# Patient Record
Sex: Female | Born: 1960 | Race: White | Hispanic: No | Marital: Single | Smoking: Never smoker
Health system: Southern US, Community
[De-identification: ages and names within clinical notes are randomized; demographics above are authoritative.]

## PROBLEM LIST (undated history)

## (undated) DIAGNOSIS — E785 Hyperlipidemia, unspecified: Secondary | ICD-10-CM

## (undated) HISTORY — PX: BACK SURGERY: SHX140

## (undated) HISTORY — DX: Hyperlipidemia, unspecified: E78.5

---

## 2001-05-29 ENCOUNTER — Inpatient Hospital Stay (HOSPITAL_COMMUNITY): Admission: RE | Admit: 2001-05-29 | Discharge: 2001-05-30 | Payer: Self-pay | Admitting: Neurosurgery

## 2001-05-29 ENCOUNTER — Encounter: Payer: Self-pay | Admitting: Neurosurgery

## 2012-10-24 ENCOUNTER — Encounter: Payer: Self-pay | Admitting: Internal Medicine

## 2013-10-19 ENCOUNTER — Encounter: Payer: Self-pay | Admitting: Internal Medicine

## 2015-09-20 DIAGNOSIS — Z23 Encounter for immunization: Secondary | ICD-10-CM | POA: Diagnosis not present

## 2016-05-24 ENCOUNTER — Ambulatory Visit (INDEPENDENT_AMBULATORY_CARE_PROVIDER_SITE_OTHER): Payer: BLUE CROSS/BLUE SHIELD | Admitting: Family Medicine

## 2016-05-24 ENCOUNTER — Other Ambulatory Visit: Payer: Self-pay | Admitting: Family Medicine

## 2016-05-24 ENCOUNTER — Encounter: Payer: Self-pay | Admitting: Family Medicine

## 2016-05-24 VITALS — BP 120/80 | HR 71 | Temp 98.2°F | Wt 231.6 lb

## 2016-05-24 DIAGNOSIS — Z7689 Persons encountering health services in other specified circumstances: Secondary | ICD-10-CM | POA: Diagnosis not present

## 2016-05-24 DIAGNOSIS — R1011 Right upper quadrant pain: Secondary | ICD-10-CM | POA: Diagnosis not present

## 2016-05-24 LAB — COMPREHENSIVE METABOLIC PANEL
ALT: 52 U/L — ABNORMAL HIGH (ref 6–29)
AST: 35 U/L (ref 10–35)
Albumin: 4.1 g/dL (ref 3.6–5.1)
Alkaline Phosphatase: 142 U/L — ABNORMAL HIGH (ref 33–130)
BUN: 8 mg/dL (ref 7–25)
CO2: 25 mmol/L (ref 20–31)
Calcium: 9.3 mg/dL (ref 8.6–10.4)
Chloride: 104 mmol/L (ref 98–110)
Creat: 0.83 mg/dL (ref 0.50–1.05)
Glucose, Bld: 91 mg/dL (ref 65–99)
Potassium: 4.3 mmol/L (ref 3.5–5.3)
Sodium: 141 mmol/L (ref 135–146)
Total Bilirubin: 1.1 mg/dL (ref 0.2–1.2)
Total Protein: 6.8 g/dL (ref 6.1–8.1)

## 2016-05-24 LAB — CBC WITH DIFFERENTIAL/PLATELET
Basophils Absolute: 0 cells/uL (ref 0–200)
Basophils Relative: 0 %
Eosinophils Absolute: 92 cells/uL (ref 15–500)
Eosinophils Relative: 2 %
HCT: 39.4 % (ref 35.0–45.0)
Hemoglobin: 12.7 g/dL (ref 11.7–15.5)
Lymphocytes Relative: 32 %
Lymphs Abs: 1472 cells/uL (ref 850–3900)
MCH: 30 pg (ref 27.0–33.0)
MCHC: 32.2 g/dL (ref 32.0–36.0)
MCV: 92.9 fL (ref 80.0–100.0)
MPV: 9.2 fL (ref 7.5–12.5)
Monocytes Absolute: 322 cells/uL (ref 200–950)
Monocytes Relative: 7 %
Neutro Abs: 2714 cells/uL (ref 1500–7800)
Neutrophils Relative %: 59 %
Platelets: 296 10*3/uL (ref 140–400)
RBC: 4.24 MIL/uL (ref 3.80–5.10)
RDW: 13.1 % (ref 11.0–15.0)
WBC: 4.6 10*3/uL (ref 4.0–10.5)

## 2016-05-24 NOTE — Progress Notes (Signed)
   Subjective:    Patient ID: Andrea Riggs, female    DOB: [birth year], 56 y.o.   MRN: [id number]  HPI Chief Complaint  Patient presents with  . new pt    new pt get established. stomach pain that comes and goes. severe stomach pain 4 years ago but hasn't had it severe since   She is new to the practice and here to establish care.   Previous medical care: no PCP Last CPE: years   4 year history of intermittent RUQ pain. 1 episode every 3-4 months. 2 episodes this year so far.  3 weeks ago RUQ pain that felt like pressure. Denies pain today.  States pain is not affected by eating. Not affected by movement.  Sates she took a Gas X and Ibuprofen and pain improved several hours after passing a lot of gas.   Denies changes in bowel movements. Denies blood or pus in stool. No unexplained fatigue or weight loss.    Denies fever, chills, N/V/D, constipation, urinary symptoms, vaginal discharge.   LMP: 2009   Other providers: none   Social history: Lives alone , works as a Academic librarian  Denies smoking, drinking alcohol, drug use  Diet: fairly unhealthy  Excerise: nothing   Health maintenance:  Mammogram: 2008  Colonoscopy: never  Last Gynecological Exam: years ago Last Menstrual cycle: 2009  Reviewed allergies, medications, past medical, surgical, family, and social history.    Review of Systems Pertinent positives and negatives in the history of present illness.     Objective:   Physical Exam BP 120/80   Pulse 71   Temp 98.2 F (36.8 C) (Oral)   Wt 231 lb 9.6 oz (105.1 kg)  Alert and in no distress. Tympanic membranes and canals are normal. Pharyngeal area is normal. Neck is supple without adenopathy or thyromegaly. Cardiac exam shows a regular sinus rhythm without murmurs or gallops. Lungs are clear to auscultation. Abdomen soft, non tender, normal bowel sounds, no guarding, rebound, or hepatosplenomegaly. Negative McMurrays, negative McBurneys. Extremities  without edema, normal pulses. Skin warm and dry, no pallor.       Assessment & Plan:  RUQ abdominal pain - Plan: CBC with Differential/Platelet, Comprehensive metabolic panel  Encounter to establish care  Discussed that her exam is benign. Due to her family history of colon cancer and the fact that she has never had a colonoscopy, I will get her back next week for a fasting CPE and refer her for a colonoscopy. She is deficient in several areas of health maintenance and we will update these.

## 2016-05-25 ENCOUNTER — Other Ambulatory Visit: Payer: Self-pay | Admitting: Family Medicine

## 2016-05-25 DIAGNOSIS — R7989 Other specified abnormal findings of blood chemistry: Secondary | ICD-10-CM

## 2016-05-25 DIAGNOSIS — R945 Abnormal results of liver function studies: Principal | ICD-10-CM

## 2016-05-26 LAB — HEPATITIS PANEL, ACUTE
HCV Ab: NEGATIVE
Hep A IgM: NONREACTIVE
Hep B C IgM: NONREACTIVE
Hepatitis B Surface Ag: NEGATIVE

## 2016-05-30 ENCOUNTER — Encounter: Payer: Self-pay | Admitting: Family Medicine

## 2016-05-30 ENCOUNTER — Ambulatory Visit (INDEPENDENT_AMBULATORY_CARE_PROVIDER_SITE_OTHER): Payer: BLUE CROSS/BLUE SHIELD | Admitting: Family Medicine

## 2016-05-30 ENCOUNTER — Other Ambulatory Visit (HOSPITAL_COMMUNITY)
Admission: RE | Admit: 2016-05-30 | Discharge: 2016-05-30 | Disposition: A | Discharge status: Home / Self Care | Payer: BLUE CROSS/BLUE SHIELD | Source: Ambulatory Visit | Attending: Family Medicine | Admitting: Family Medicine

## 2016-05-30 VITALS — BP 130/84 | HR 82 | Ht 63.0 in | Wt 224.8 lb

## 2016-05-30 DIAGNOSIS — Z1211 Encounter for screening for malignant neoplasm of colon: Secondary | ICD-10-CM | POA: Diagnosis not present

## 2016-05-30 DIAGNOSIS — Z1159 Encounter for screening for other viral diseases: Secondary | ICD-10-CM | POA: Diagnosis not present

## 2016-05-30 DIAGNOSIS — Z1231 Encounter for screening mammogram for malignant neoplasm of breast: Secondary | ICD-10-CM | POA: Diagnosis not present

## 2016-05-30 DIAGNOSIS — Z0000 Encounter for general adult medical examination without abnormal findings: Secondary | ICD-10-CM

## 2016-05-30 DIAGNOSIS — E2839 Other primary ovarian failure: Secondary | ICD-10-CM

## 2016-05-30 DIAGNOSIS — Z833 Family history of diabetes mellitus: Secondary | ICD-10-CM

## 2016-05-30 DIAGNOSIS — Z114 Encounter for screening for human immunodeficiency virus [HIV]: Secondary | ICD-10-CM

## 2016-05-30 DIAGNOSIS — Z8349 Family history of other endocrine, nutritional and metabolic diseases: Secondary | ICD-10-CM

## 2016-05-30 DIAGNOSIS — Z1239 Encounter for other screening for malignant neoplasm of breast: Secondary | ICD-10-CM

## 2016-05-30 DIAGNOSIS — Z124 Encounter for screening for malignant neoplasm of cervix: Secondary | ICD-10-CM

## 2016-05-30 DIAGNOSIS — E669 Obesity, unspecified: Secondary | ICD-10-CM

## 2016-05-30 DIAGNOSIS — Z23 Encounter for immunization: Secondary | ICD-10-CM

## 2016-05-30 DIAGNOSIS — Z01419 Encounter for gynecological examination (general) (routine) without abnormal findings: Secondary | ICD-10-CM | POA: Diagnosis not present

## 2016-05-30 DIAGNOSIS — Z800 Family history of malignant neoplasm of digestive organs: Secondary | ICD-10-CM

## 2016-05-30 LAB — LIPID PANEL
Cholesterol: 231 mg/dL — ABNORMAL HIGH (ref <200)
HDL: 44 mg/dL — ABNORMAL LOW (ref >50)
LDL Cholesterol: 170 mg/dL — ABNORMAL HIGH (ref <100)
Total CHOL/HDL Ratio: 5.3 Ratio — ABNORMAL HIGH (ref <5.0)
Triglycerides: 86 mg/dL (ref <150)
VLDL: 17 mg/dL (ref <30)

## 2016-05-30 LAB — POCT URINALYSIS DIPSTICK
Bilirubin, UA: NEGATIVE
Blood, UA: NEGATIVE
Glucose, UA: NEGATIVE
Ketones, UA: NEGATIVE
Leukocytes, UA: NEGATIVE
Nitrite, UA: NEGATIVE
Protein, UA: NEGATIVE
Spec Grav, UA: 1.01 (ref 1.010–1.025)
Urobilinogen, UA: 0.2 E.U./dL
pH, UA: 6 (ref 5.0–8.0)

## 2016-05-30 NOTE — Progress Notes (Signed)
Subjective:    Patient ID: Andrea Riggs, female    DOB: 08/26/1960, 56 y.o.   MRN: [id number]  HPI Chief Complaint  Patient presents with  . Annual Exam   She is here for a complete physical exam. 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  Diet: unhealthy  Excerise: none   Toenail fungus for years, not bothersome.   Immunizations: Tdap unknown   Health maintenance:  Mammogram: 2008 Colonoscopy: never and family history significant for colon cancer in father and brother.  Last Gynecological Exam: 2008 Last Menstrual cycle: 2009 Last Dental Exam: last month  Last Eye Exam: never  Depression screen [facility] 2/9 05/30/2016  Decreased Interest 0  Down, Depressed, Hopeless 0  PHQ - 2 Score 0     Wears seatbelt always, uses sunscreen, smoke detectors in home and functioning, does not text while driving and feels safe in home environment.   Reviewed allergies, medications, past medical, surgical, family, and social history.    Review of Systems Review of Systems Constitutional: -fever, -chills, -sweats, -unexpected weight change,-fatigue ENT: -runny nose, -ear pain, -sore throat Cardiology:  -chest pain, -palpitations, -edema Respiratory: -cough, -shortness of breath, -wheezing Gastroenterology: -abdominal pain, -nausea, -vomiting, -diarrhea, -constipation  Hematology: -bleeding or bruising problems Musculoskeletal: -arthralgias, -myalgias, -joint swelling, -back pain Ophthalmology: -vision changes Urology: -dysuria, -difficulty urinating, -hematuria, -urinary frequency, -urgency Neurology: -headache, -weakness, -tingling, -numbness       Objective:   Physical Exam BP 130/84   Pulse 82   Ht 5\' 3"  (1.6 m)   Wt 224 lb 12.8 oz (102 kg)   SpO2 97%   BMI 39.82 kg/m   General Appearance:    Alert, cooperative, no distress, appears stated age   Head:    Normocephalic, without obvious abnormality, atraumatic  Eyes:    PERRL, conjunctiva/corneas clear, EOM's intact, fundi    benign  Ears:    Normal TM's and external ear canals  Nose:   Nares normal, mucosa normal, no drainage or sinus   tenderness  Throat:   Lips, mucosa, and tongue normal; teeth and gums normal  Neck:   Supple, no lymphadenopathy;  thyroid:  no   enlargement/tenderness/nodules; no carotid   bruit or JVD  Back:    Spine nontender, no curvature, ROM normal, no CVA     tenderness  Lungs:     Clear to auscultation bilaterally without wheezes, rales or     ronchi; respirations unlabored  Chest Wall:    No tenderness or deformity   Heart:    Regular rate and rhythm, S1 and S2 normal, no murmur, rub   or gallop  Breast Exam:    No tenderness, masses, or nipple discharge or inversion.      No axillary lymphadenopathy  Abdomen:     Soft, non-tender, nondistended, normoactive bowel sounds,    no masses, no hepatosplenomegaly  Genitalia:    Normal external genitalia without lesions.  BUS and vagina normal; cervix without lesions, or cervical motion tenderness. No abnormal vaginal discharge.  Uterus and adnexa not enlarged, nontender, no masses.  Pap performed. Chaperone present.   Rectal:    Referred to GI   Extremities:   No clubbing, cyanosis or edema  Pulses:   2+ and symmetric all extremities  Skin:   Skin color, texture, turgor normal, no rashes or lesions  Lymph nodes:  Cervical, supraclavicular, and axillary nodes normal  Neurologic:   CNII-XII intact, normal strength, sensation and gait; reflexes 2+ and symmetric throughout          Psych:   Normal mood, affect, hygiene and grooming.    Urinalysis dipstick: spec grav 1.010, neg      Assessment & Plan:  Routine general medical examination at a health care facility - Plan: POCT urinalysis dipstick, TSH, Lipid panel  Obesity (BMI 30-39.9) - Plan: TSH, Lipid panel  Screening for breast cancer - Plan: MM DIGITAL  SCREENING BILATERAL  Screening for colon cancer - Plan: Ambulatory referral to Gastroenterology  Need for Tdap vaccination - Plan: Tdap vaccine greater than or equal to 7yo IM  Family history of diabetes mellitus in first degree relative - Plan: Hemoglobin A1c  Family history of thyroid disease in mother - Plan: TSH  Family history of colon cancer in father - Plan: Ambulatory referral to Gastroenterology  Screening for cervical cancer - Plan: Cytology - PAP  Estrogen deficiency - Plan: DG Bone Density  Screening for HIV (human immunodeficiency virus) - Plan: HIV antibody  She admittedly has not been good about preventive health and I congratulated her on coming in today for her CPE.  Counseled her on obesity and the potential health risks this presents. Advised on healthy diet and exercise.  Counseled on health maintenance and safety.  GI referral for colonoscopy.  Ordered mammogram and bone density.  Pap smear performed.  Tdap given.  Plan to have her follow up pending lab results and after her abdominal US next week for elevated LFTs and intermittent RUQ pain.

## 2016-05-30 NOTE — Patient Instructions (Addendum)
Call and schedule your mammogram and bone density.  The GI office will call you to discuss colonoscopy and office visit.  Call and schedule an eye exam.   Here is a list of Eye Doctors that you call and schedule an appointment with.   [facility] Address: [place], [place], [place] [address] Phone: [phone]   [facility] Address: [address] [place], [place], [place] [address] Phone: [phone]  Dr. Katy Fitch Address: [address] [place], [place] [address] Phone: [phone]  [facility] [address], [place], [place]  [address] Telephone: [phone]  Preventative Care for Adults - Female      [place]:  A routine yearly physical is a good way to check in with your primary care provider about your health and preventive screening. It is also an opportunity to share updates about your health and any concerns you have, and receive a thorough all-over exam.   Most health insurance companies pay for at least some preventative services.  Check with your health plan for specific coverages.  WHAT PREVENTATIVE SERVICES DO WOMEN NEED?  Adult women should have their weight and blood pressure checked regularly.   Women age 38 and older should have their cholesterol levels checked regularly.  Women should be screened for cervical cancer with a Pap smear and pelvic exam beginning at either age 16, or 3 years after they become sexually activity.    Breast cancer screening generally begins at age 42 with a mammogram and breast exam by your primary care provider.    Beginning at age 35 and continuing to age 22, women should be screened for colorectal cancer.  Certain people may need continued testing until age 70.  Updating vaccinations is part of preventative care.  Vaccinations help protect against diseases such as the flu.  Osteoporosis is a disease in which the bones lose minerals and strength as we age. Women ages 38  and over should discuss this with their caregivers, as should women after menopause who have other risk factors.  Lab tests are generally done as part of preventative care to screen for anemia and blood disorders, to screen for problems with the kidneys and liver, to screen for bladder problems, to check blood sugar, and to check your cholesterol level.  Preventative services generally include counseling about diet, exercise, avoiding tobacco, drugs, excessive alcohol consumption, and sexually transmitted infections.    GENERAL RECOMMENDATIONS FOR GOOD HEALTH:  Healthy diet:  Eat a variety of foods, including fruit, vegetables, animal or vegetable protein, such as meat, fish, chicken, and eggs, or beans, lentils, tofu, and grains, such as rice.  Drink plenty of water daily.  Decrease saturated fat in the diet, avoid lots of red meat, processed foods, sweets, fast foods, and fried foods.  Exercise:  Aerobic exercise helps maintain good heart health. At least 30-40 minutes of moderate-intensity exercise is recommended. For example, a brisk walk that increases your heart rate and breathing. This should be done on most days of the week.   Find a type of exercise or a variety of exercises that you enjoy so that it becomes a part of your daily life.  Examples are running, walking, swimming, water aerobics, and biking.  For motivation and support, explore group exercise such as aerobic class, spin class, Zumba, Yoga,or  martial arts, etc.    Set exercise goals for yourself, such as a certain weight goal, walk or run  in a race such as a 5k walk/run.  Speak to your primary care provider about exercise goals.  Disease prevention:  If you smoke or chew tobacco, find out from your caregiver how to quit. It can literally save your life, no matter how long you have been a tobacco user. If you do not use tobacco, never begin.   Maintain a healthy diet and normal weight. Increased weight leads to  problems with blood pressure and diabetes.   The Body Mass Index or BMI is a way of measuring how much of your body is fat. Having a BMI above 27 increases the risk of heart disease, diabetes, hypertension, stroke and other problems related to obesity. Your caregiver can help determine your BMI and based on it develop an exercise and dietary program to help you achieve or maintain this important measurement at a healthful level.  High blood pressure causes heart and blood vessel problems.  Persistent high blood pressure should be treated with medicine if weight loss and exercise do not work.   Fat and cholesterol leaves deposits in your arteries that can block them. This causes heart disease and vessel disease elsewhere in your body.  If your cholesterol is found to be high, or if you have heart disease or certain other medical conditions, then you may need to have your cholesterol monitored frequently and be treated with medication.   Ask if you should have a cardiac stress test if your history suggests this. A stress test is a test done on a treadmill that looks for heart disease. This test can find disease prior to there being a problem.  Menopause can be associated with physical symptoms and risks. Hormone replacement therapy is available to decrease these. You should talk to your caregiver about whether starting or continuing to take hormones is right for you.   Osteoporosis is a disease in which the bones lose minerals and strength as we age. This can result in serious bone fractures. Risk of osteoporosis can be identified using a bone density scan. Women ages 56 and over should discuss this with their caregivers, as should women after menopause who have other risk factors. Ask your caregiver whether you should be taking a calcium supplement and Vitamin D, to reduce the rate of osteoporosis.   Avoid drinking alcohol in excess (more than two drinks per day).  Avoid use of street drugs. Do not  share needles with anyone. Ask for professional help if you need assistance or instructions on stopping the use of alcohol, cigarettes, and/or drugs.  Brush your teeth twice a day with fluoride toothpaste, and floss once a day. Good oral hygiene prevents tooth decay and gum disease. The problems can be painful, unattractive, and can cause other health problems. Visit your dentist for a routine oral and dental check up and preventive care every 6-12 months.   Look at your skin regularly.  Use a mirror to look at your back. Notify your caregivers of changes in moles, especially if there are changes in shapes, colors, a size larger than a pencil eraser, an irregular border, or development of new moles.  Safety:  Use seatbelts 100% of the time, whether driving or as a passenger.  Use safety devices such as hearing protection if you work in environments with loud noise or significant background noise.  Use safety glasses when doing any work that could send debris in to the eyes.  Use a helmet if you ride a bike or motorcycle.  Use  appropriate safety gear for contact sports.  Talk to your caregiver about gun safety.  Use sunscreen with a SPF (or skin protection factor) of 15 or greater.  Lighter skinned people are at a greater risk of skin cancer. Don't forget to also wear sunglasses in order to protect your eyes from too much damaging sunlight. Damaging sunlight can accelerate cataract formation.   Practice safe sex. Use condoms. Condoms are used for birth control and to help reduce the spread of sexually transmitted infections (or STIs).  Some of the STIs are gonorrhea (the clap), chlamydia, syphilis, trichomonas, herpes, HPV (human papilloma virus) and HIV (human immunodeficiency virus) which causes AIDS. The herpes, HIV and HPV are viral illnesses that have no cure. These can result in disability, cancer and death.   Keep carbon monoxide and smoke detectors in your home functioning at all times. Change  the batteries every 6 months or use a model that plugs into the wall.   Vaccinations:  Stay up to date with your tetanus shots and other required immunizations. You should have a booster for tetanus every 10 years. Be sure to get your flu shot every year, since 5%-20% of the [place] population comes down with the flu. The flu vaccine changes each year, so being vaccinated once is not enough. Get your shot in the fall, before the flu season peaks.   Other vaccines to consider:  Human Papilloma Virus or HPV causes cancer of the cervix, and other infections that can be transmitted from person to person. There is a vaccine for HPV, and females should get immunized between the ages of 36 and 20. It requires a series of 3 shots.   Pneumococcal vaccine to protect against certain types of pneumonia.  This is normally recommended for adults age 63 or older.  However, adults younger than 56 years old with certain underlying conditions such as diabetes, heart or lung disease should also receive the vaccine.  Shingles vaccine to protect against Varicella Zoster if you are older than age 30, or younger than 56 years old with certain underlying illness.  Hepatitis A vaccine to protect against a form of infection of the liver by a virus acquired from food.  Hepatitis B vaccine to protect against a form of infection of the liver by a virus acquired from blood or body fluids, particularly if you work in health care.  If you plan to travel internationally, check with your local health department for specific vaccination recommendations.  Cancer Screening:  Breast cancer screening is essential to preventive care for women. All women age 41 and older should perform a breast self-exam every month. At age 27 and older, women should have their caregiver complete a breast exam each year. Women at ages 65 and older should have a mammogram (x-ray film) of the breasts. Your caregiver can discuss how often you need  mammograms.    Cervical cancer screening includes taking a Pap smear (sample of cells examined under a microscope) from the cervix (end of the uterus). It also includes testing for HPV (Human Papilloma Virus, which can cause cervical cancer). Screening and a pelvic exam should begin at age 12, or 3 years after a woman becomes sexually active. Screening should occur every year, with a Pap smear but no HPV testing, up to age 99. After age 44, you should have a Pap smear every 3 years with HPV testing, if no HPV was found previously.   Most routine colon cancer screening begins at the age  of 50. On a yearly basis, doctors may provide special easy to use take-home tests to check for hidden blood in the stool. Sigmoidoscopy or colonoscopy can detect the earliest forms of colon cancer and is life saving. These tests use a small camera at the end of a tube to directly examine the colon. Speak to your caregiver about this at age 64, when routine screening begins (and is repeated every 5 years unless early forms of pre-cancerous polyps or small growths are found).

## 2016-05-31 LAB — TSH: TSH: 3.16 mIU/L

## 2016-05-31 LAB — HEMOGLOBIN A1C
Hgb A1c MFr Bld: 5.5 % (ref <5.7)
MEAN PLASMA GLUCOSE: 111 mg/dL

## 2016-05-31 LAB — HIV ANTIBODY (ROUTINE TESTING W REFLEX): HIV: NONREACTIVE

## 2016-06-05 ENCOUNTER — Encounter: Payer: Self-pay | Admitting: Gastroenterology

## 2016-06-05 ENCOUNTER — Ambulatory Visit
Admission: RE | Admit: 2016-06-05 | Discharge: 2016-06-05 | Disposition: A | Discharge status: Home / Self Care | Payer: Self-pay | Source: Ambulatory Visit | Attending: Family Medicine | Admitting: Family Medicine

## 2016-06-05 DIAGNOSIS — R945 Abnormal results of liver function studies: Principal | ICD-10-CM

## 2016-06-05 DIAGNOSIS — K760 Fatty (change of) liver, not elsewhere classified: Secondary | ICD-10-CM | POA: Diagnosis not present

## 2016-06-05 DIAGNOSIS — R7989 Other specified abnormal findings of blood chemistry: Secondary | ICD-10-CM

## 2016-06-05 LAB — CYTOLOGY - PAP
DIAGNOSIS: NEGATIVE
HPV: NOT DETECTED

## 2016-06-25 ENCOUNTER — Encounter: Payer: Self-pay | Admitting: Gastroenterology

## 2016-06-25 ENCOUNTER — Ambulatory Visit (AMBULATORY_SURGERY_CENTER): Payer: Self-pay

## 2016-06-25 VITALS — Ht 63.0 in | Wt 225.2 lb

## 2016-06-25 DIAGNOSIS — Z1211 Encounter for screening for malignant neoplasm of colon: Secondary | ICD-10-CM

## 2016-06-25 MED ORDER — NA SULFATE-K SULFATE-MG SULF 17.5-3.13-1.6 GM/177ML PO SOLN: 1.0000 | Freq: Once | ORAL | 0 refills | Status: AC

## 2016-06-25 NOTE — Progress Notes (Signed)
Denies allergies to eggs or soy products. Denies complication of anesthesia or sedation. Denies use of weight loss medication. Denies use of O2.   Emmi instructions given for colonoscopy.  

## 2016-07-05 ENCOUNTER — Telehealth: Payer: Self-pay

## 2016-07-05 NOTE — Telephone Encounter (Signed)
Patient confirms she can arrive at 1:30 pm for her scheduled procedure.

## 2016-07-05 NOTE — Telephone Encounter (Signed)
Left message to call back. Dr Silverio Decamp needs her to arrive at 1:30 pm on 07/09/16. That is 1 hour forward from the original arrival time of 12:30 pm. Ask the patient to call back and confirm she has this message and understands.

## 2016-07-09 ENCOUNTER — Encounter: Payer: Self-pay | Admitting: Gastroenterology

## 2016-07-09 ENCOUNTER — Ambulatory Visit (AMBULATORY_SURGERY_CENTER): Payer: BLUE CROSS/BLUE SHIELD | Admitting: Gastroenterology

## 2016-07-09 VITALS — BP 121/71 | HR 60 | Temp 98.2°F | Resp 23 | Ht 63.0 in | Wt 225.0 lb

## 2016-07-09 DIAGNOSIS — D124 Benign neoplasm of descending colon: Secondary | ICD-10-CM

## 2016-07-09 DIAGNOSIS — D125 Benign neoplasm of sigmoid colon: Secondary | ICD-10-CM | POA: Diagnosis not present

## 2016-07-09 DIAGNOSIS — D126 Benign neoplasm of colon, unspecified: Secondary | ICD-10-CM

## 2016-07-09 DIAGNOSIS — Z1211 Encounter for screening for malignant neoplasm of colon: Secondary | ICD-10-CM | POA: Diagnosis present

## 2016-07-09 DIAGNOSIS — D128 Benign neoplasm of rectum: Secondary | ICD-10-CM

## 2016-07-09 DIAGNOSIS — Z1212 Encounter for screening for malignant neoplasm of rectum: Secondary | ICD-10-CM

## 2016-07-09 DIAGNOSIS — K621 Rectal polyp: Secondary | ICD-10-CM

## 2016-07-09 DIAGNOSIS — K635 Polyp of colon: Secondary | ICD-10-CM

## 2016-07-09 DIAGNOSIS — Z800 Family history of malignant neoplasm of digestive organs: Secondary | ICD-10-CM | POA: Diagnosis not present

## 2016-07-09 MED ORDER — SODIUM CHLORIDE 0.9 % IV SOLN: 500.0000 mL | INTRAVENOUS | Status: AC

## 2016-07-09 NOTE — Patient Instructions (Signed)
YOU HAD AN ENDOSCOPIC PROCEDURE TODAY AT [facility]:   Refer to the procedure report that was given to you for any specific questions about what was found during the examination.  If the procedure report does not answer your questions, please call your gastroenterologist to clarify.  If you requested that your care partner not be given the details of your procedure findings, then the procedure report has been included in a sealed envelope for you to review at your convenience later.  YOU SHOULD EXPECT: Some feelings of bloating in the abdomen. Passage of more gas than usual.  Walking can help get rid of the air that was put into your GI tract during the procedure and reduce the bloating. If you had a lower endoscopy (such as a colonoscopy or flexible sigmoidoscopy) you may notice spotting of blood in your stool or on the toilet paper. If you underwent a bowel prep for your procedure, you may not have a normal bowel movement for a few days.  Please Note:  You might notice some irritation and congestion in your nose or some drainage.  This is from the oxygen used during your procedure.  There is no need for concern and it should clear up in a day or so.  SYMPTOMS TO REPORT IMMEDIATELY:   Following lower endoscopy (colonoscopy or flexible sigmoidoscopy):  Excessive amounts of blood in the stool  Significant tenderness or worsening of abdominal pains  Swelling of the abdomen that is new, acute  Fever of 100F or higher    For urgent or emergent issues, a gastroenterologist can be reached at any hour by calling [phone].   DIET:  We do recommend a small meal at first, but then you may proceed to your regular diet.  Drink plenty of fluids but you should avoid alcoholic beverages for 24 hours.  ACTIVITY:  You should plan to take it easy for the rest of today and you should NOT DRIVE or use heavy machinery until tomorrow (because of the sedation medicines used during the test).     FOLLOW UP: Our staff will call the number listed on your records the next business day following your procedure to check on you and address any questions or concerns that you may have regarding the information given to you following your procedure. If we do not reach you, we will leave a message.  However, if you are feeling well and you are not experiencing any problems, there is no need to return our call.  We will assume that you have returned to your regular daily activities without incident.  If any biopsies were taken you will be contacted by phone or by letter within the next 1-3 weeks.  Please call us at [phone] if you have not heard about the biopsies in 3 weeks.    SIGNATURES/CONFIDENTIALITY: You and/or your care partner have signed paperwork which will be entered into your electronic medical record.  These signatures attest to the fact that that the information above on your After Visit Summary has been reviewed and is understood.  Full responsibility of the confidentiality of this discharge information lies with you and/or your care-partner.   Information on polyps & hemorrhoids given to you today  Await pathology results

## 2016-07-09 NOTE — Op Note (Signed)
[facility] Patient Name: Andrea Riggs Procedure Date: 07/09/2016 2:30 PM MRN: [id number] Endoscopist: Mauri Pole , MD Age: 56 Referring MD:  Date of Birth: 1960/10/22 Gender: Female Account #: [id number] Procedure:                Colonoscopy Indications:              Screening in patient at increased risk: Colorectal                            cancer in father before age 53, Screening in                            patient at increased risk: Colorectal cancer in                            brother before age 61 Medicines:                Monitored Anesthesia Care Procedure:                Pre-Anesthesia Assessment:                           - Prior to the procedure, a History and Physical                            was performed, and patient medications and                            allergies were reviewed. The patient's tolerance of                            previous anesthesia was also reviewed. The risks                            and benefits of the procedure and the sedation                            options and risks were discussed with the patient.                            All questions were answered, and informed consent                            was obtained. Prior Anticoagulants: The patient has                            taken no previous anticoagulant or antiplatelet                            agents. ASA Grade Assessment: II - A patient with                            mild systemic disease. After reviewing the risks  and benefits, the patient was deemed in                            satisfactory condition to undergo the procedure.                           After obtaining informed consent, the colonoscope                            was passed under direct vision. Throughout the                            procedure, the patient's blood pressure, pulse, and                            oxygen saturations were monitored  continuously. The                            Model PCF-[id number]) scope was introduced                            through the anus and advanced to the the cecum,                            identified by appendiceal orifice and ileocecal                            valve. The colonoscopy was performed without                            difficulty. The patient tolerated the procedure                            well. The quality of the bowel preparation was                            excellent. The ileocecal valve, appendiceal                            orifice, and rectum were photographed. Scope In: 2:39:05 PM Scope Out: 3:01:14 PM Scope Withdrawal Time: 0 hours 15 minutes 14 seconds  Total Procedure Duration: 0 hours 22 minutes 9 seconds  Findings:                 The perianal and digital rectal examinations were                            normal.                           A 2 mm polyp was found in the descending colon. The                            polyp was sessile. The polyp was removed with a  cold biopsy forceps. Resection and retrieval were                            complete.                           Four sessile polyps were found in the rectum X1 and                            sigmoid colon X3 The polyps were 3 to 6 mm in size.                            These polyps were removed with a cold snare.                            Resection and retrieval were complete.                           Non-bleeding internal hemorrhoids were found during                            retroflexion. The hemorrhoids were small. Complications:            No immediate complications. Estimated Blood Loss:     Estimated blood loss was minimal. Impression:               - One 2 mm polyp in the descending colon, removed                            with a cold biopsy forceps. Resected and retrieved.                           - Four 3 to 6 mm polyps in the rectum and in the                             sigmoid colon, removed with a cold snare. Resected                            and retrieved.                           - Non-bleeding internal hemorrhoids. Recommendation:           - Patient has a contact number available for                            emergencies. The signs and symptoms of potential                            delayed complications were discussed with the                            patient. Return to normal activities tomorrow.  Written discharge instructions were provided to the                            patient.                           - Resume previous diet.                           - Continue present medications.                           - Await pathology results.                           - Repeat colonoscopy in 3 - 5 years for                            surveillance based on pathology results. Mauri Pole, MD 07/09/2016 3:07:19 PM This report has been signed electronically.

## 2016-07-09 NOTE — Progress Notes (Signed)
Called to room to assist during endoscopic procedure.  Patient ID and intended procedure confirmed with present staff. Received instructions for my participation in the procedure from the performing physician.  

## 2016-07-09 NOTE — Progress Notes (Signed)
Pt's states no medical or surgical changes since previsit or office visit. 

## 2016-07-09 NOTE — Progress Notes (Signed)
To recovery, report to RN, VSS. 

## 2016-07-10 ENCOUNTER — Telehealth: Payer: Self-pay | Admitting: *Deleted

## 2016-07-10 NOTE — Telephone Encounter (Signed)
  Follow up Call-  Call back number 07/09/2016  Post procedure Call Back phone  # [phone]  Permission to leave phone message Yes  Some recent data might be hidden     Patient questions:  Do you have a fever, pain , or abdominal swelling? No. Pain Score  0 *  Have you tolerated food without any problems? Yes.    Have you been able to return to your normal activities? Yes.    Do you have any questions about your discharge instructions: Diet   No. Medications  No. Follow up visit  No.  Do you have questions or concerns about your Care? No.  Actions: * If pain score is 4 or above: No action needed, pain <4.

## 2016-07-13 ENCOUNTER — Encounter: Payer: Self-pay | Admitting: Gastroenterology

## 2016-07-15 ENCOUNTER — Encounter: Payer: Self-pay | Admitting: Family Medicine

## 2016-07-15 DIAGNOSIS — R933 Abnormal findings on diagnostic imaging of other parts of digestive tract: Secondary | ICD-10-CM | POA: Insufficient documentation

## 2016-09-25 DIAGNOSIS — Z23 Encounter for immunization: Secondary | ICD-10-CM | POA: Diagnosis not present

## 2017-10-22 DIAGNOSIS — Z23 Encounter for immunization: Secondary | ICD-10-CM | POA: Diagnosis not present

## 2018-09-25 IMAGING — US US ABDOMEN COMPLETE
1 series · 14 of 25 positions shown · non-contrast
Comparison: No prior.

CLINICAL DATA: Elevated LFTs.

EXAM:
ABDOMEN ULTRASOUND COMPLETE

[Series 1: us abdomen complete · 0.18mm/px · 14 of 75 slices shown]
[im 1/75]
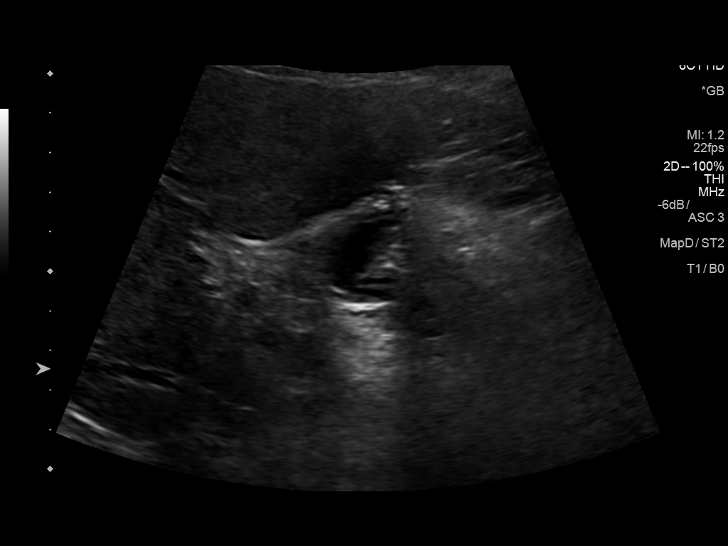
[im 7/75]
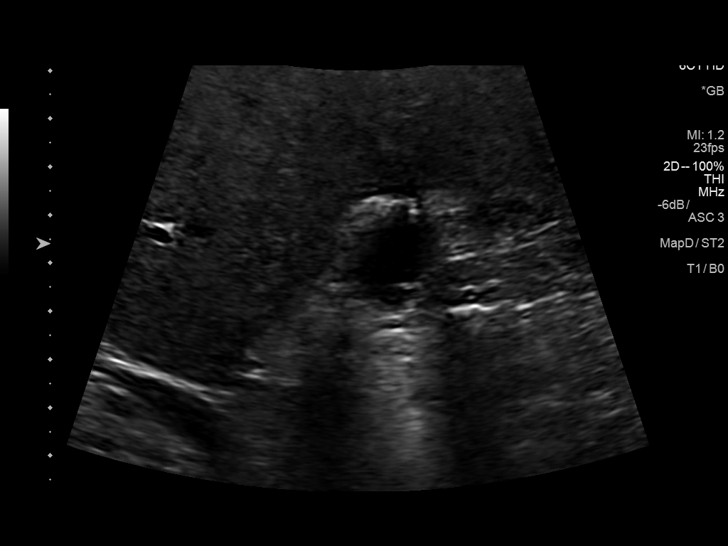
[im 13/75]
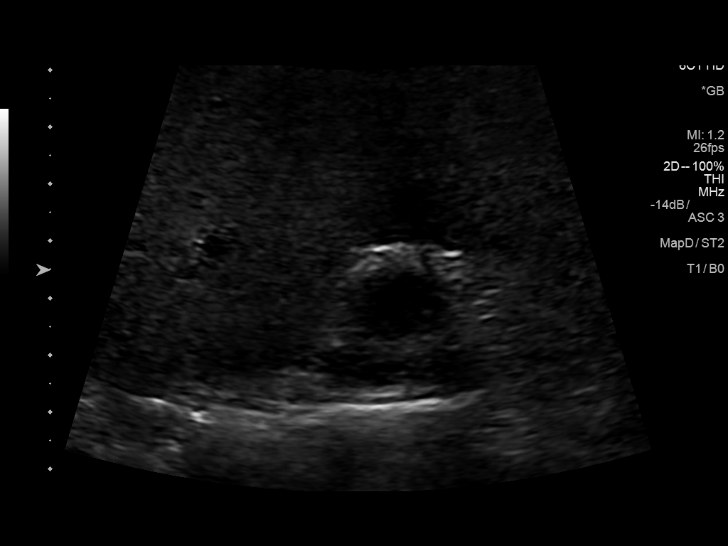
[im 19/75]
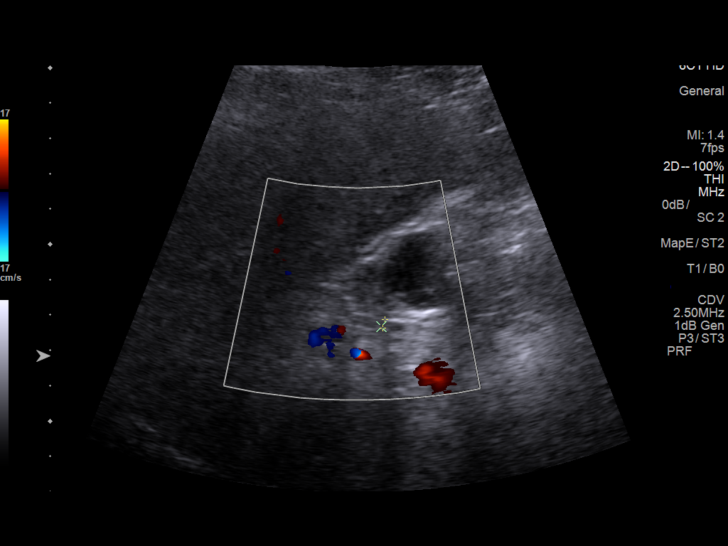
[im 25/75]
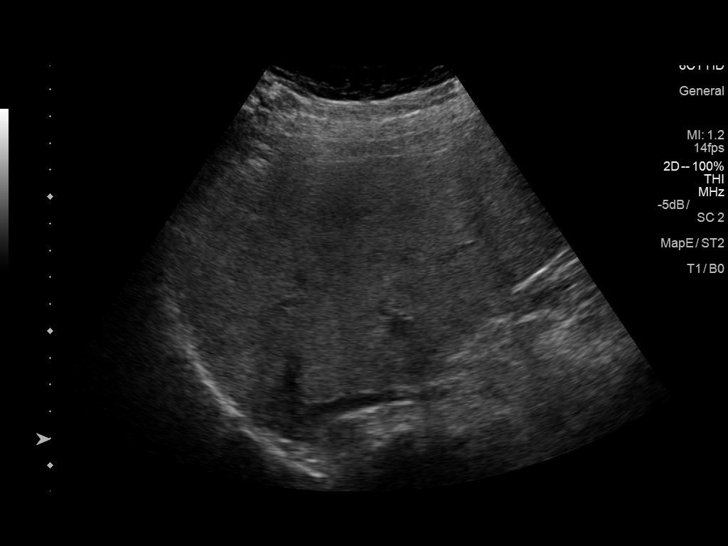
[im 28/75]
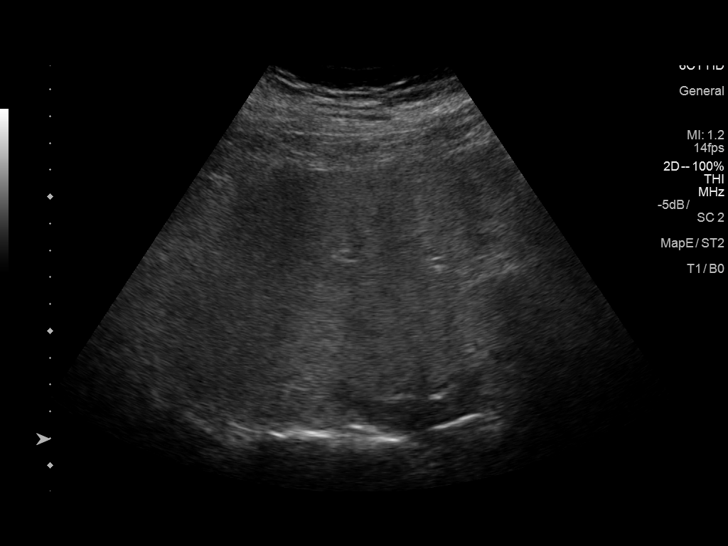
[im 34/75]
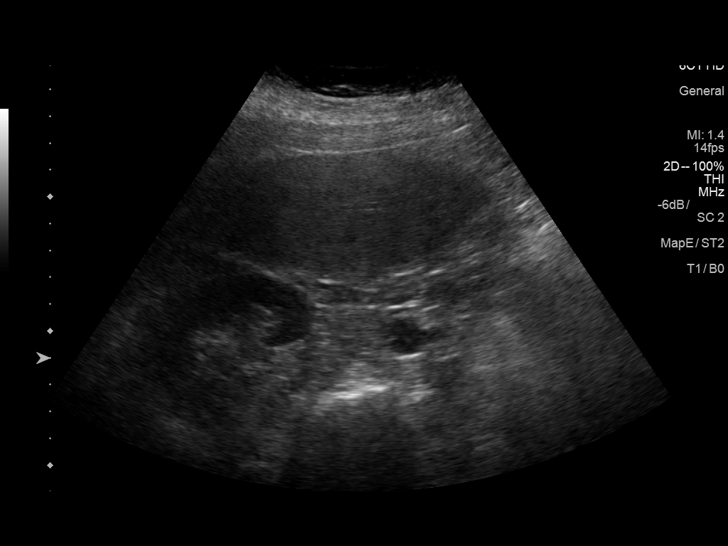
[im 41/75]
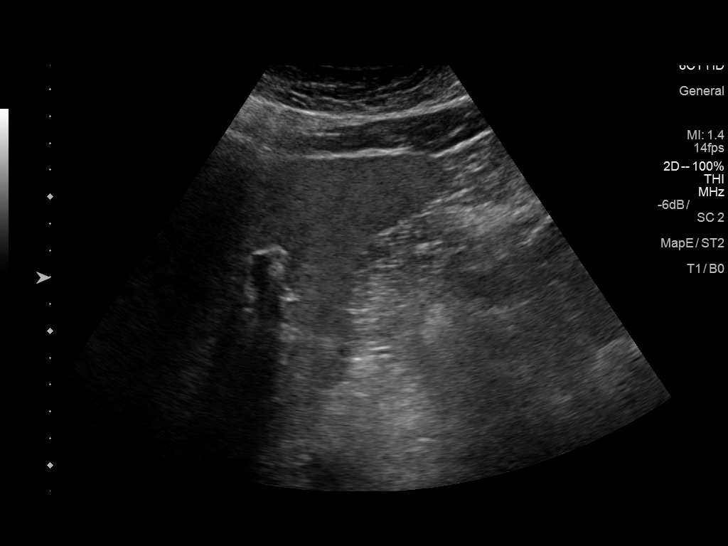
[im 47/75]
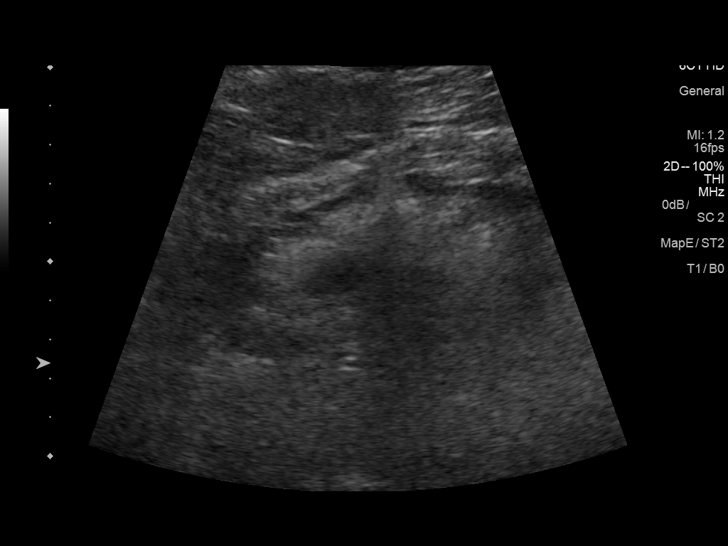
[im 50/75]
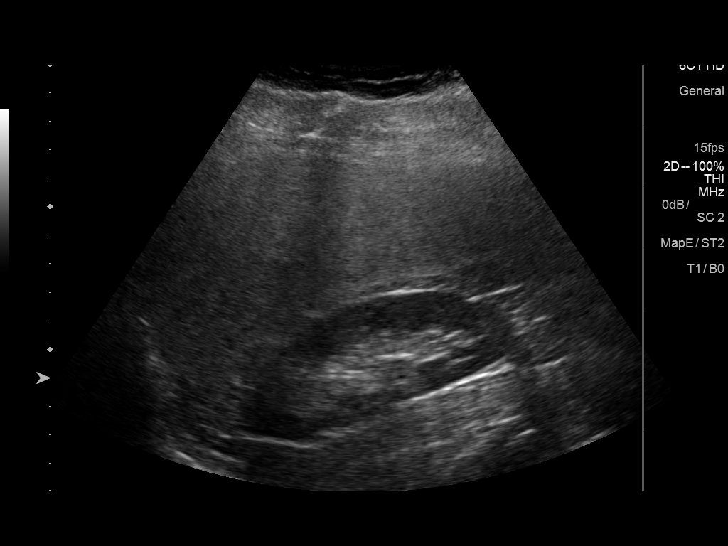
[im 56/75]
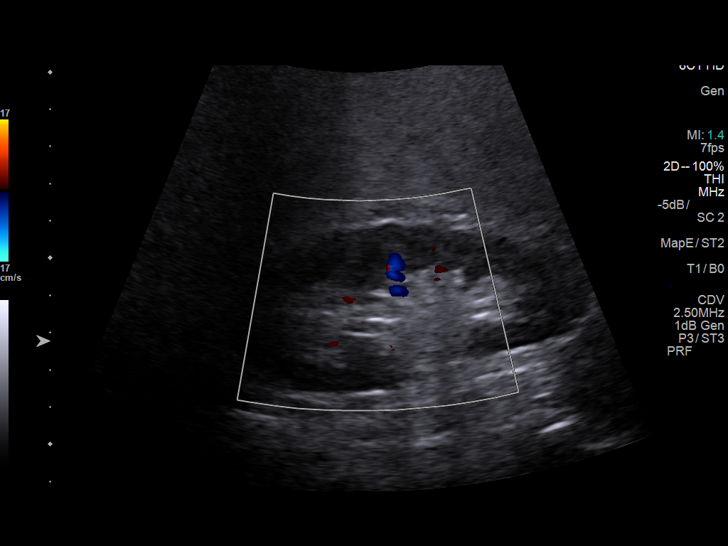
[im 62/75]
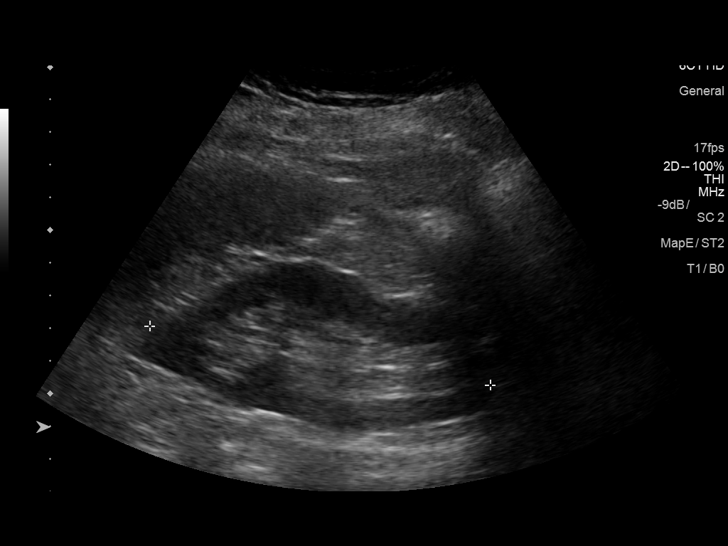
[im 68/75]
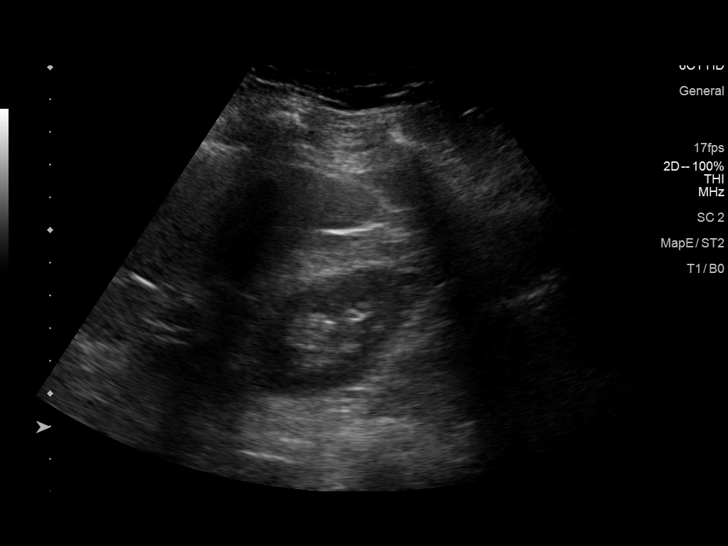
[im 75/75]
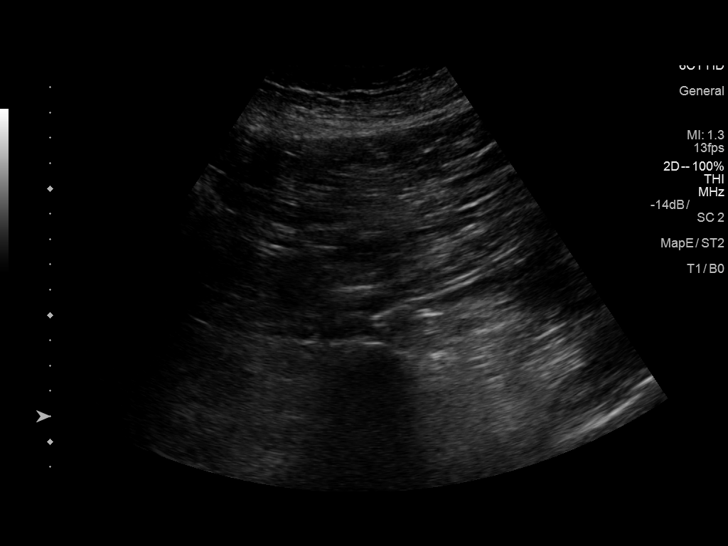

[14 of 25 positions shown; findings below may reference images not displayed]

FINDINGS: Gallbladder: Gallbladder is contracted. No definite gallstones
noted. No sonographic Murphy sign noted by sonographer.

Common bile duct: Diameter: 2.5 mm

Liver: Increased echogenicity consistent fatty infiltration and/or
pedis 80 disease. No focal hepatic abnormality identified.

IVC: No abnormality visualized.

Pancreas: Visualized portion unremarkable.

Spleen: Size and appearance within normal limits.

Right Kidney: Length: 10.0 cm. Echogenicity within normal limits. No
mass or hydronephrosis visualized.

Left Kidney: Length: 10.5 cm. Echogenicity within normal limits. No
mass or hydronephrosis visualized.

Abdominal aorta: No aneurysm visualized.

Other findings: None.
IMPRESSION: 1. Increased echogenicity of the liver consistent fatty infiltration
and/or hepatocellular disease. No focal hepatic abnormality
identified.

2. No acute abnormality.

## 2019-04-23 ENCOUNTER — Ambulatory Visit: Payer: Self-pay | Attending: Internal Medicine

## 2019-04-23 DIAGNOSIS — Z23 Encounter for immunization: Secondary | ICD-10-CM

## 2019-04-23 NOTE — Progress Notes (Signed)
   Covid-19 Vaccination Clinic  Name:  Andrea Riggs    MRN: [id number] DOB: 12-31-1960  04/23/2019  [name] was observed post Covid-19 immunization for 15 minutes without incident. She was provided with Vaccine Information Sheet and instruction to access the V-Safe system.   [name] was instructed to call 911 with any severe reactions post vaccine: Marland Kitchen Difficulty breathing  . Swelling of face and throat  . A fast heartbeat  . A bad rash all over body  . Dizziness and weakness   Immunizations Administered    Name Date Dose VIS Date Route   Pfizer COVID-19 Vaccine 04/23/2019  9:55 AM 0.3 mL 12/19/2018 Intramuscular   Manufacturer: [place]   Lot: [id number]   [place]: [id number]

## 2019-05-11 ENCOUNTER — Ambulatory Visit: Payer: Self-pay | Attending: Internal Medicine

## 2019-05-11 DIAGNOSIS — Z23 Encounter for immunization: Secondary | ICD-10-CM

## 2019-05-11 NOTE — Progress Notes (Signed)
   Covid-19 Vaccination Clinic  Name:  Andrea Riggs    MRN: [id number] DOB: 1960-07-12  05/11/2019  [name] was observed post Covid-19 immunization for 15 minutes without incident. She was provided with Vaccine Information Sheet and instruction to access the V-Safe system.   [name] was instructed to call 911 with any severe reactions post vaccine: Marland Kitchen Difficulty breathing  . Swelling of face and throat  . A fast heartbeat  . A bad rash all over body  . Dizziness and weakness   Immunizations Administered    Name Date Dose VIS Date Route   Pfizer COVID-19 Vaccine 05/11/2019 12:00 PM 0.3 mL 03/04/2018 Intramuscular   Manufacturer: [place]   Lot: [id number]   [place]: [id number]

## 2021-08-03 ENCOUNTER — Encounter: Payer: Self-pay | Admitting: Gastroenterology
